# Patient Record
Sex: Female | Born: 1935 | Marital: Single
Health system: Southern US, Community
[De-identification: ages and names within clinical notes are randomized; demographics above are authoritative.]

---

## 2004-10-12 ENCOUNTER — Other Ambulatory Visit: Payer: Self-pay

## 2004-10-12 ENCOUNTER — Inpatient Hospital Stay: Payer: Self-pay | Admitting: Internal Medicine

## 2006-09-25 ENCOUNTER — Other Ambulatory Visit: Payer: Self-pay

## 2006-09-26 ENCOUNTER — Inpatient Hospital Stay: Payer: Self-pay | Admitting: Internal Medicine

## 2006-09-30 ENCOUNTER — Other Ambulatory Visit: Payer: Self-pay

## 2011-12-11 ENCOUNTER — Ambulatory Visit: Payer: Self-pay | Admitting: Internal Medicine

## 2012-01-02 ENCOUNTER — Inpatient Hospital Stay: Payer: Self-pay | Admitting: Internal Medicine

## 2012-01-02 LAB — CBC WITH DIFFERENTIAL/PLATELET
Basophil %: 0.8 %
Eosinophil #: 0.4 10*3/uL (ref 0.0–0.7)
HCT: 33.7 % — ABNORMAL LOW (ref 35.0–47.0)
HGB: 10.8 g/dL — ABNORMAL LOW (ref 12.0–16.0)
Lymphocyte #: 1.5 10*3/uL (ref 1.0–3.6)
Lymphocyte %: 14.6 %
MCH: 26.2 pg (ref 26.0–34.0)
MCHC: 31.9 g/dL — ABNORMAL LOW (ref 32.0–36.0)
MCV: 82 fL (ref 80–100)
Monocyte %: 11.6 %
Neutrophil #: 7.3 10*3/uL — ABNORMAL HIGH (ref 1.4–6.5)
Neutrophil %: 69.6 %
Platelet: 429 10*3/uL (ref 150–440)
RBC: 4.11 10*6/uL (ref 3.80–5.20)
RDW: 15.6 % — ABNORMAL HIGH (ref 11.5–14.5)
WBC: 10.5 10*3/uL (ref 3.6–11.0)

## 2012-01-02 LAB — TROPONIN I: Troponin-I: <0.02 ng/mL

## 2012-01-02 LAB — PRO B NATRIURETIC PEPTIDE: B-Type Natriuretic Peptide: 1313 pg/mL — ABNORMAL HIGH (ref 0–450)

## 2012-01-02 LAB — COMPREHENSIVE METABOLIC PANEL
Albumin: 2.4 g/dL — ABNORMAL LOW (ref 3.4–5.0)
Anion Gap: 8 (ref 7–16)
Bilirubin,Total: 0.4 mg/dL (ref 0.2–1.0)
Calcium, Total: 8.7 mg/dL (ref 8.5–10.1)
Chloride: 95 mmol/L — ABNORMAL LOW (ref 98–107)
Co2: 31 mmol/L (ref 21–32)
Creatinine: 0.56 mg/dL — ABNORMAL LOW (ref 0.60–1.30)
EGFR (Non-African Amer.): >60
Osmolality: 268 (ref 275–301)
Potassium: 2.8 mmol/L — ABNORMAL LOW (ref 3.5–5.1)
SGPT (ALT): 11 U/L — ABNORMAL LOW
Sodium: 134 mmol/L — ABNORMAL LOW (ref 136–145)
Total Protein: 7.8 g/dL (ref 6.4–8.2)

## 2012-01-03 DIAGNOSIS — I059 Rheumatic mitral valve disease, unspecified: Secondary | ICD-10-CM

## 2012-01-05 LAB — IRON AND TIBC
Iron Bind.Cap.(Total): 413 ug/dL (ref 250–450)
Unbound Iron-Bind.Cap.: 373 ug/dL

## 2012-01-05 LAB — POTASSIUM: Potassium: 4.4 mmol/L (ref 3.5–5.1)

## 2012-01-05 LAB — HEMOGLOBIN: HGB: 11.2 g/dL — ABNORMAL LOW (ref 12.0–16.0)

## 2012-01-05 LAB — PLATELET COUNT: Platelet: 400 10*3/uL (ref 150–440)

## 2012-01-05 LAB — CREATININE, SERUM: EGFR (African American): >60

## 2012-01-05 LAB — FERRITIN: Ferritin (ARMC): 55 ng/mL (ref 8–388)

## 2012-01-06 LAB — CULTURE, BLOOD (SINGLE)

## 2012-01-08 LAB — PROT IMMUNOELECTROPHORES(ARMC)

## 2012-01-08 LAB — CULTURE, BLOOD (SINGLE)

## 2012-01-08 LAB — CEA: CEA: 10.8 ng/mL — ABNORMAL HIGH (ref 0.0–4.7)

## 2012-01-11 ENCOUNTER — Ambulatory Visit: Payer: Self-pay | Admitting: Internal Medicine

## 2012-10-10 ENCOUNTER — Ambulatory Visit: Payer: Self-pay | Admitting: Internal Medicine

## 2012-10-18 ENCOUNTER — Inpatient Hospital Stay: Payer: Self-pay | Admitting: Orthopedic Surgery

## 2012-10-18 ENCOUNTER — Ambulatory Visit: Payer: Self-pay | Admitting: Orthopedic Surgery

## 2012-10-18 LAB — COMPREHENSIVE METABOLIC PANEL
Albumin: 2.6 g/dL — ABNORMAL LOW (ref 3.4–5.0)
Alkaline Phosphatase: 101 U/L (ref 50–136)
Bilirubin,Total: 0.4 mg/dL (ref 0.2–1.0)
Calcium, Total: 8 mg/dL — ABNORMAL LOW (ref 8.5–10.1)
Chloride: 89 mmol/L — ABNORMAL LOW (ref 98–107)
Osmolality: 261 (ref 275–301)
Potassium: 2.8 mmol/L — ABNORMAL LOW (ref 3.5–5.1)
SGOT(AST): 21 U/L (ref 15–37)
SGPT (ALT): 9 U/L — ABNORMAL LOW (ref 12–78)
Sodium: 130 mmol/L — ABNORMAL LOW (ref 136–145)
Total Protein: 6.9 g/dL (ref 6.4–8.2)

## 2012-10-18 LAB — CBC WITH DIFFERENTIAL/PLATELET
Basophil %: 0.7 %
Eosinophil #: 0.5 10*3/uL (ref 0.0–0.7)
Eosinophil %: 4.4 %
HCT: 29.6 % — ABNORMAL LOW (ref 35.0–47.0)
Lymphocyte #: 1.5 10*3/uL (ref 1.0–3.6)
MCHC: 33 g/dL (ref 32.0–36.0)
Neutrophil #: 8.8 10*3/uL — ABNORMAL HIGH (ref 1.4–6.5)
Neutrophil %: 72.9 %
Platelet: 295 10*3/uL (ref 150–440)
RDW: 15.2 % — ABNORMAL HIGH (ref 11.5–14.5)

## 2012-10-18 LAB — PROTIME-INR: INR: 1

## 2012-10-19 LAB — URINALYSIS, COMPLETE
Bacteria: NONE SEEN
Bilirubin,UR: NEGATIVE
Glucose,UR: NEGATIVE mg/dL (ref 0–75)
Ketone: NEGATIVE
Nitrite: NEGATIVE
RBC,UR: 2 /HPF (ref 0–5)
Specific Gravity: 1.013 (ref 1.003–1.030)
Squamous Epithelial: <1
WBC UR: 11 /HPF (ref 0–5)

## 2012-10-19 LAB — POTASSIUM: Potassium: 3 mmol/L — ABNORMAL LOW (ref 3.5–5.1)

## 2012-10-20 LAB — BASIC METABOLIC PANEL
Anion Gap: 6 — ABNORMAL LOW (ref 7–16)
Calcium, Total: 7.9 mg/dL — ABNORMAL LOW (ref 8.5–10.1)
Chloride: 96 mmol/L — ABNORMAL LOW (ref 98–107)
Co2: 31 mmol/L (ref 21–32)
EGFR (Non-African Amer.): 50 — ABNORMAL LOW
Glucose: 141 mg/dL — ABNORMAL HIGH (ref 65–99)
Osmolality: 269 (ref 275–301)
Sodium: 133 mmol/L — ABNORMAL LOW (ref 136–145)

## 2012-10-20 LAB — PRO B NATRIURETIC PEPTIDE: B-Type Natriuretic Peptide: 5759 pg/mL — ABNORMAL HIGH (ref 0–450)

## 2012-10-20 LAB — HEMOGLOBIN
HGB: 7.9 g/dL — ABNORMAL LOW (ref 12.0–16.0)
HGB: 10.2 g/dL — ABNORMAL LOW (ref 12.0–16.0)

## 2012-10-21 LAB — CBC WITH DIFFERENTIAL/PLATELET
Eosinophil #: 0 10*3/uL (ref 0.0–0.7)
Lymphocyte #: 0.9 10*3/uL — ABNORMAL LOW (ref 1.0–3.6)
Neutrophil %: 91.8 %
RDW: 14.9 % — ABNORMAL HIGH (ref 11.5–14.5)

## 2012-10-21 LAB — URINE CULTURE

## 2012-11-05 LAB — CBC
HCT: 29.6 % — ABNORMAL LOW (ref 35.0–47.0)
MCHC: 32.6 g/dL (ref 32.0–36.0)
MCV: 83 fL (ref 80–100)
RBC: 3.58 10*6/uL — ABNORMAL LOW (ref 3.80–5.20)
RDW: 17.8 % — ABNORMAL HIGH (ref 11.5–14.5)
WBC: 14.2 10*3/uL — ABNORMAL HIGH (ref 3.6–11.0)

## 2012-11-05 LAB — COMPREHENSIVE METABOLIC PANEL
Albumin: 2.7 g/dL — ABNORMAL LOW (ref 3.4–5.0)
BUN: 16 mg/dL (ref 7–18)
Co2: 28 mmol/L (ref 21–32)
EGFR (African American): >60
EGFR (Non-African Amer.): >60
Glucose: 114 mg/dL — ABNORMAL HIGH (ref 65–99)
Osmolality: 272 (ref 275–301)
SGOT(AST): 22 U/L (ref 15–37)
Total Protein: 7.5 g/dL (ref 6.4–8.2)

## 2012-11-05 LAB — TROPONIN I: Troponin-I: <0.02 ng/mL

## 2012-11-06 ENCOUNTER — Inpatient Hospital Stay: Payer: Self-pay | Admitting: Internal Medicine

## 2012-11-08 LAB — CBC WITH DIFFERENTIAL/PLATELET
Basophil #: 0 10*3/uL (ref 0.0–0.1)
Basophil %: 0.4 %
Eosinophil #: 0 10*3/uL (ref 0.0–0.7)
Eosinophil %: 0 %
HGB: 9.1 g/dL — ABNORMAL LOW (ref 12.0–16.0)
Lymphocyte #: 1.1 10*3/uL (ref 1.0–3.6)
MCH: 26.8 pg (ref 26.0–34.0)
MCHC: 32.9 g/dL (ref 32.0–36.0)
MCV: 81 fL (ref 80–100)
Monocyte %: 5.9 %
Platelet: 350 10*3/uL (ref 150–440)

## 2012-11-08 LAB — BASIC METABOLIC PANEL
Anion Gap: 7 (ref 7–16)
BUN: 16 mg/dL (ref 7–18)
Calcium, Total: 8.5 mg/dL (ref 8.5–10.1)
Creatinine: 0.78 mg/dL (ref 0.60–1.30)
EGFR (Non-African Amer.): >60
Glucose: 118 mg/dL — ABNORMAL HIGH (ref 65–99)
Osmolality: 263 (ref 275–301)

## 2012-11-10 ENCOUNTER — Ambulatory Visit: Payer: Self-pay | Admitting: Internal Medicine

## 2012-11-10 LAB — BASIC METABOLIC PANEL
Anion Gap: 4 — ABNORMAL LOW (ref 7–16)
Calcium, Total: 8.3 mg/dL — ABNORMAL LOW (ref 8.5–10.1)
Chloride: 97 mmol/L — ABNORMAL LOW (ref 98–107)
Creatinine: 0.92 mg/dL (ref 0.60–1.30)
EGFR (Non-African Amer.): 59 — ABNORMAL LOW
Glucose: 182 mg/dL — ABNORMAL HIGH (ref 65–99)
Potassium: 4.1 mmol/L (ref 3.5–5.1)
Sodium: 132 mmol/L — ABNORMAL LOW (ref 136–145)

## 2012-11-10 LAB — CBC WITH DIFFERENTIAL/PLATELET
Basophil %: 0 %
Eosinophil %: 0 %
HCT: 31.7 % — ABNORMAL LOW (ref 35.0–47.0)
HGB: 10.1 g/dL — ABNORMAL LOW (ref 12.0–16.0)
Lymphocyte #: 1 10*3/uL (ref 1.0–3.6)
Lymphocyte %: 5.2 %
MCHC: 31.8 g/dL — ABNORMAL LOW (ref 32.0–36.0)
Monocyte %: 5.8 %
Neutrophil #: 16.3 10*3/uL — ABNORMAL HIGH (ref 1.4–6.5)
Platelet: 344 10*3/uL (ref 150–440)
RDW: 17.4 % — ABNORMAL HIGH (ref 11.5–14.5)
WBC: 18.3 10*3/uL — ABNORMAL HIGH (ref 3.6–11.0)

## 2012-11-11 LAB — CULTURE, BLOOD (SINGLE)

## 2012-11-11 LAB — WBC: WBC: 18.5 10*3/uL — ABNORMAL HIGH (ref 3.6–11.0)

## 2012-12-10 DEATH — deceased

## 2014-12-02 NOTE — Discharge Summary (Signed)
PATIENT NAME:  Jill Nolan, Jill Nolan MR#:  [id number] DATE OF BIRTH:  05-27-1930  DATE OF ADMISSION:  11/06/2012 DATE OF DISCHARGE:  11/11/2012  ADMITTING PHYSICIAN:  Dr. Rudene Rearwish.   DISCHARGING PHYSICIAN:  Dr. Enid Baasadhika Ajamu Maxon.   PRIMARY CARE PHYSICIAN:  Dr. Walden FieldBillett.  PRIMARY ONCOLOGIST:  Dr. Lorre NickGittin.  CONSULTATIONS IN THE HOSPITAL:  1.  Palliative care consultation by Dr. Harriett SineNancy Phifer.  DISCHARGE DIAGNOSES: 1.  Sepsis. 2.  Healthcare-acquired pneumonia. 3.  Chronic obstructive pulmonary disease.  4.  Congestive heart failure, systolic dysfunction, ejection fraction of 45%.  5.  Atrial fib with rapid ventricular response.    6.  Rheumatoid arthritis.  7.  Right lung and liver mass, possible malignancy. The patient declined further.  8.  Bilateral heel pressure ulcers.  9.  Recent hip fracture.   DISCHARGE MEDICATIONS:   1.  Roxanol solution 20 mg/mL - 0.25 mL every 2 hours as needed for pain and dyspnea.  2.  Norco 5/325 mg 1 tablet q.4 p.r.n. pain.  3.  Lisinopril 5 mg p.o. daily.  4.  Maalox 30 mL q.6 hours p.r.n. for indigestion.  5.  Xanax 0.25 mg q.8 hours p.r.n. for anxiety.  6.  Coreg 6.25 mg p.o. b.i.d.  7.  Potassium chloride 20 mEq p.o. daily.  8.  Ranitidine 150 mg p.o. daily.  9.  Spiriva capsule 1 inhalation daily.  10.  Symbicort 160/4.5, 2 puffs b.i.d.  11.  Combivent Respimat 1 puff 4 times a day.  12.  DuoNeb 3 mL q.4 hours.  13.  Levaquin 500 mg p.o. daily for 5 more days.  14.  Lasix 40 mg p.o. daily.  15.  Augmentin 875 mg 1 tablet p.o. b.i.d. for 5 more days.   DISCHARGE DIET: Low-sodium, renal diet.   DISCHARGE OXYGEN:  Three liters.   DISCHARGE ACTIVITY: As tolerated.     FOLLOWUP INSTRUCTIONS: 1.  Hospice services at home.  2.  PCP followup in 2 weeks.   LABORATORY DATA AND IMAGING SERVICES:  1.  WBC 18.3, hemoglobin 10.1, hematocrit 31.7, platelet count 244.  2.  Sodium 132, potassium 4.1, chloride 97, bicarbonate 31, BUN 18, creatinine 0.92,  glucose 182, and calcium 8.3.  3.  Chest x-ray showing persistent right middle lobe and lower lobe pneumonias with bilateral pleural effusions and mild cardiomegaly.  4.  Blood cultures growing coagulase-negative staph.   BRIEF HOSPITAL COURSE:  The patient is an 81 year old elderly female with past medical history significant for hypertension, A. fib, congestive heart failure, COPD and recent admission to the hospital 2 weeks ago for fall and right hip fracture, status post surgery, was discharged to [facility]. Was brought in secondary to increased shortness of breath. Chest x-ray revealed right middle lobe and lower lobe infiltrates.   1.  Septic shock secondary to healthcare-acquired pneumonia. She was started on broad-spectrum antibiotics while in the hospital. Blood cultures initially were positive, but they were growing coagulase-negative staph, so vancomycin was discontinued,  she was on Rocephin and Levaquin while in the hospital and is being changed to Augmentin and Levaquin at time of discharge. She does have right lung mass that was found on CAT scan about a year ago, and seen by Dr. Lorre NickGittin, and also there was a spot in the liver. PET scan was ordered, but the patient refused at the time. Continues to refuse any further workup.  Her chest x-ray actually did not show any improvement. She has persistent white count; however, her breathing has improved,  and she is on 2 liters nasal cannula right now. The patient is very adamant of not going back to [facility] and does want to go home at the time of discharge. She was seen by palliative care,  who has arranged for hospice services at home.  The patient is fully alert and capable of making her own decisions. She was able to get out of bed with minimal assistance and to the bedside commode. Hospice is going to provide further care.  Her family was contacted and spoke with daughter-in-law, who lives with the patient, along with the patient's son,  and they are very worried about the patient coming home instead of going back to [facility], but they do understand that it is her choice to make, and they are in the process of getting a guardianship over the patient, and they do understand the patient has chances of repeat hospitalizations, and they want to avoid that and see if her condition declines, and they can take her to  hospice home.   2.   Atrial fib with rapid ventricular response.  The patient is not a candidate for anticoagulation with falls and recent hip surgery. Her rate is better controlled with Coreg at this time.  3.  Chronic obstructive pulmonary disease, not in acute exacerbation, not on systemic steroids.   Continue inhalers at this time.  4.  Recent right hip surgery. Follow up with ortho as an outpatient, working with physical therapy and pain medications p.r.n.    Her course has been otherwise uneventful in the hospital.   DISCHARGE CONDITION: Guarded with poor prognosis.   DISCHARGE DISPOSITION:  Home with hospice.   Time spent on discharge is 45 minutes.   ____________________________ Enid Baas, MD rk:dmm D: 11/12/2012 13:07:00 ET T: 11/12/2012 13:23:59 ET JOB#: [id number]  cc: Enid Baas, MD, <Dictator> Enid Baas MD ELECTRONICALLY SIGNED 11/27/2012 15:44

## 2014-12-02 NOTE — Consult Note (Signed)
PATIENT NAME:  Jill Nolan, Jill Nolan MR#:  [id number] DATE OF BIRTH:  [birth year]  DATE OF CONSULTATION:  11/06/2012  REFERRING PHYSICIAN:   CONSULTING PHYSICIAN:  Linus Galasodd Bitha Fauteux, DPM  REASON FOR CONSULTATION:  This is an 81 year old female with a history of ulcers on both of her heels who was recently admitted to the hospital for shortness of breath with evidence of pneumonia. Denies any specific injury to the heels.   PAST MEDICAL HISTORY: 1.  Right lung mass suspected to be cancer. 2. COPD primarily emphysema.  3. Chronic atrial fibrillation. 4. Chronic systolic heart failure.  5. Rheumatoid arthritis. 6. Systemic hypertension, 7. Tobacco abuse.   PAST SURGICAL HISTORY:  Recent left hip fracture, distant hysterectomy and cataracts.   SOCIAL HISTORY:  She is widowed. Right now lives at a nursing home.   SOCIAL HABITS:  She is an ex-chronic smoker. Quit smoking earlier this month. Also history of alcohol use but none recently.   REVIEW OF SYSTEMS:  Has had shortness of breath causing her admission. No chest pain. Denies any numbness or paresthesias in the feet. No complaints of swelling in the legs. Denies any stomach pain or heartburn.   PHYSICAL EXAMINATION: VASCULAR: DP pulse is nonpalpable on the right, trace at best on the left. PT pulse nonpalpable on the right and, again, trace at best. Capillary filling time appears to be intact.  NEUROLOGICAL:  Epicritic sensations appear to be grossly intact.  INTEGUMENT: Skin is dry and atrophic with absent hair growth. There is a scabbed-over ulceration on the posterior aspect of the left heel approximately 8 to 10 mm diameter with a fissured area going more plantar. No active drainage or sign of infection. Larger ulceration on the posterior aspect of the right heel approximately 2 cm x 1.5 cm. Again, no clear signs of cellulitis or infection.  MUSCULOSKELETAL: Exquisite pain on any attempted palpation or motion in the feet or ankles. Muscle testing  deferred.   IMPRESSION: 1.  Bilateral heel ulcerations.  2.  Evidence for some degree of vascular compromise.   PLAN: At this point, neither of the ulcerations appears to be infected. Would continue with offloading with the pressure relief boots and dressing changes. The patient will follow up as needed outpatient if no resolution of the ulcerations. Also, could consider vascular consult but at this point I do not clearly know that it is needed.     ____________________________ Linus Galasodd Nevin Grizzle, DPM tc:ce D: 11/06/2012 13:14:36 ET T: 11/06/2012 14:24:01 ET JOB#: [id number]  cc: Linus Galasodd Haidyn Kilburg, DPM, <Dictator> Euriah Matlack DPM ELECTRONICALLY SIGNED 11/14/2012 10:55

## 2014-12-02 NOTE — Consult Note (Signed)
Brief Consult Note: Diagnosis: left pertrochanteric fracture.   Patient was seen by consultant.   Consult note dictated.   Recommend further assessment or treatment.   Orders entered.   Discussed with Attending MD.   Comments: 81 yo with PMHx of COP (end stage on 3.5L O2) afib, tobacco abuse, RA, HTN, sCHF,  ?lung ca on hospice care being evaluated for preop medical clearance for left hip fracture.  1. Preop evaluation -METs 1 to 4, uses a walker - active cardiac condition --> sCHF, atrial fibrillation - Clinical risk factors --> ho of CHF, tobacco abuse, COPD on o2 - patent at moderate risk for cardiac complications and high risk for pulmonary complications - cont with home dose beta blocker - would recommend maximize medical mangement of cardiac issues and pulmonary status prior to surgery  2. HTN - continue with coreg  3. COPD - end stage on hospice care, currently only on 3.5L of O2 via [place] - currently still smoking 3-4 cigs per day, tobacco cessation provided.  - maintain saturation 88-92%  4. Left hip fracture  - management per surgery - pain management per surgery - conservative use of narcotics and benzos in th post operative period - may need to use positive pressure (Bipap or CPAP) in the post operative period  5. ? Lung Cancer - noted to have RLL nodular density at last admission, per son, they have followed up with Dr. Lorre NickGittin and Dr. Mayo AoFlemming, the concensus decision is to have no intervention (ie bronchoscopies, biopsies, chemo, radiation), and proceed with home hospice.   6. Recent Bronchitis - noted to have productive green to yellow sputum, treated with 10 days of augmentin, currently with mild yellow sputum, but with overall improvement.  - cbc in the am - sputum culture  DNR\DNI PMD - Dr. Letta KocherWillet  Time spent evaluating patient = 55 minutes Job# [id number].  Electronic Signatures: Stephanie AcreMungal, Vishal (MD)  (Signed 09-Mar-14 22:57)  Authored: Brief Consult  Note   Last Updated: 09-Mar-14 22:57 by Stephanie AcreMungal, Vishal (MD)

## 2014-12-02 NOTE — Op Note (Signed)
PATIENT NAME:  Jill Nolan, Jill Nolan MR#:  [id number] DATE OF BIRTH:  [birth year]  DATE OF PROCEDURE:  10/19/2012  PREOPERATIVE DIAGNOSIS:  Right intertrochanteric hip fracture.   POSTOPERATIVE DIAGNOSIS:  Right intertrochanteric hip fracture.  PROCEDURE PERFORMED: Open reduction and internal fixation with a long Affixus nail, right intertrochanteric hip fracture.   SURGEON: Leitha SchullerMichael J. Marshaun Lortie, MD.   DESCRIPTION OF PROCEDURE: The patient was brought to the operating room and after adequate anesthesia was obtained, the patient was placed on the fracture table. The left leg was in the well legholder, right leg in the traction boot. After bringing in C-arm and showing acceptable reduction in both AP and lateral projections, the hip was prepped and draped using the barrier drape method. After patient identification and timeout procedures were carried out, a small incision was made just proximal to the greater trochanter. A guidewire was inserted through the tip of the trochanter into the proximal shaft where proximal reaming was carried out. A long guidewire was then down the canal, measurements made off of this for rod length determination. A 13 mm reamer was placed down the canal without much chatter and an 11 x 360 mm long Affixus nail was inserted down the canal, and then inserted to the appropriate depth.  Using the guide from 130 degree arm, a small lateral incision was made and a guidewire inserted into a center/center position on the head, measured at 100 mm, drilling carried out and then the lag screw inserted, tightened and then a quarter turn loosening to allow for compression. AP and lateral images were saved. There appeared to be essentially anatomic alignment. The insertion handle was removed. There did appear to be rotational stability with removing the leg with no traction applied, so no additional distal fixation was required.   The wounds were irrigated and closed with 2-0 Vicryl subcutaneously and  skin staples.   ESTIMATED BLOOD LOSS: 100 mL.   COMPLICATIONS: None.   SPECIMEN:  None.   IMPLANTS:  Biomet Affixus 11 x 360 mm right 130 degree nail with a 10.5 by a 100 mm lag screw.   CONDITION: To recovery room stable.     ____________________________ Leitha SchullerMichael J. Carletta Feasel, MD mjm:ct D: 10/19/2012 19:57:50 ET T: 10/20/2012 10:38:21 ET JOB#: [id number]  cc: Leitha SchullerMichael J. Lachele Lievanos, MD, <Dictator> Leitha SchullerMICHAEL J Dariella Gillihan MD ELECTRONICALLY SIGNED 10/20/2012 14:58

## 2014-12-02 NOTE — H&P (Signed)
PATIENT NAME:  Jill Nolan, Jill Nolan MR#:  [id number] DATE OF BIRTH:  January 13, 1930  DATE OF ADMISSION:  11/06/2012  PRIMARY CARE PHYSICIAN:  Dr. Barry Brunner.   REFERRING PHYSICIAN:  Dr. Glennie Isle.   CHIEF COMPLAINT:  Increased shortness of breath.   HISTORY OF PRESENT ILLNESS:  The patient is an 81 year old Caucasian female with history of chronic obstructive pulmonary disease, history of right lung lower lobe mass suspected to be cancer, followed up by Dr. Lorre Nick and also Dr. Meredeth Ide.  It was decided that conservative therapy and palliative approach after discussion with the family according to the last record earlier this month when she was admitted here on March 9th wit ah hip fracture status post fall.  The patient was at [facility] this time and noticed to be more short of breath.  Her O2 saturation dropped to 83% despite oxygen.  Brought here for evaluation and chest x-ray here revealed evidence of pneumonia involving the right lower lobe and also right middle lobe.  The patient is now in the process to be admitted for further treatment.   REVIEW OF SYSTEMS:  CONSTITUTIONAL:  The patient denies having fever.  No chills.  No fatigue.  EYES:  No blurring of vision.  No double vision.  EARS, NOSE, THROAT:  No hearing impairment.  No sore throat.  No dysphagia.  CARDIOVASCULAR:  Denies any chest pain, but reports shortness of breath.  No syncope.  RESPIRATORY:  The patient reports shortness of breath.  Denies any chest pain.  She is coughing.  No sputum production.  No hemoptysis.  GASTROINTESTINAL:  No abdominal pain, no vomiting, no diarrhea.  GENITOURINARY:  No dysuria or frequency of urination.  MUSCULOSKELETAL:  No joint pain or swelling.  No muscular pain or swelling.  INTEGUMENTARY:  No skin rash.  No ulcers.  NEUROLOGY:  No focal weakness.  No seizure activity.  No headache.  PSYCHIATRY:  No anxiety.  No depression.  ENDOCRINE:  No polyuria or polydipsia.  No heat or cold  intolerance.   PAST MEDICAL HISTORY:   1.  Recent admission on March 9th admitted with a hip fracture status post fall.  2.  Right lung mass suspected to be cancer.  The patient was evaluated by Dr. Lorre Nick and also Dr. Meredeth Ide.  Her investigation was aborted and decided conservative approach.   3.  COPD, primarily emphysema.  4.  Chronic atrial fibrillation.  5.  Chronic systolic heart failure with ejection fraction of 45% to 50%.  6.  Rheumatoid arthritis.  7.  Systemic hypertension.  8.  Tobacco abuse.   PAST SURGICAL HISTORY:  Recent left hip fracture operated earlier this month.  Hysterectomy, cataract surgery.   SOCIAL HABITS:  Ex-chronic smoker.  She tells me that she quit smoking earlier this month.  Used to smoke 1/2 pack a day since age of 31.  She used to drink two beers a day, but she quit a few months ago.   SOCIAL HISTORY:  She is widowed, right now she lives at a nursing home.   FAMILY HISTORY:  Her mother died at age of 56 from congestive heart failure.  Her father died at the age of 59 from congestive heart failure.  Her brother died from unknown cancer.    ADMISSION MEDICATIONS:  Xanax 0.25 mg q. 8 hours as needed, Symbicort 2 puffs twice a day, Spiriva 1 inhalation once a day, ranitidine 150 mg once a day, potassium chloride 20 mEq.  Norco 5/325 q.  4 hours as needed and also taking 1 tablet twice a day.  Lisinopril 5 mg a day, Lasix 40 mg a day, fluoxetine 20 mg once a day.  DuoNeb q. 4 hours as needed, Coreg 6.25 mg twice a day, Combivent inhaler as needed.  The patient just finished treatment with Zithromax 5 day pack.   ALLERGIES:  BETAPACE, UNKNOWN REACTION.  SULFA.  ALSO REPORTED GOLD SODIUM THIOMALATE.   PHYSICAL EXAMINATION: VITAL SIGNS:  Blood pressure 162/78, respiratory rate 24, pulse 100, temperature 97.7.  O2 saturation is now 98% after oxygen supplementation.  GENERAL APPEARANCE:  Elderly female, thin-looking, appears to be short of breath and cachectic.   HEAD AND NECK:  Mild pallor.  No icterus.  No cyanosis.  Ear examination revealed normal hearing, no lesions, no ulcers, no discharge.  Nasal mucosa examination revealed dry mucous membranes, no discharge, no ulcers.  Examination of the oropharyngeal area showed no ulcers, no oral thrush.  Eye examination revealed normal eyelids and conjunctivae.  Pupils about 6 mm, round, equal, sluggishly reactive to light.  NECK:  Supple.  Trachea at midline.  No thyromegaly.  No cervical lymphadenopathy.  No masses.  HEART:  Revealed distant heart sounds, regular S1, S2.  No S3 or S4.  No murmur was appreciated.  No carotid bruits.  LUNGS:  The patient is slightly tachypneic and using mildly the accessory muscles.  She has coarse rhonchi.  These are more pronounced on the right side of the chest than the left.  ABDOMEN:  Soft without tenderness.  No hepatosplenomegaly.  No masses.  No hernias.  SKIN:  Revealed no ulcers.  No subcutaneous nodules.  MUSCULOSKELETAL:  No joint swelling.  No clubbing.  NEUROLOGIC:  Cranial nerves II through XII are intact.  No focal motor deficit.  PSYCHIATRY:  The patient is alert, oriented x 2, that is for the place and also the people.  Mood and affect were normal.   LABORATORY FINDINGS AND RADIOLOGIC DATA:  Chest x-ray showed infiltrates at the right middle lobe and right lower lobe.  These are more pronounced than her chest x-ray done on March 11th of this month.  EKG showed atrial fibrillation with rapid ventricular rate at 122 per minute.  Nonspecific T-wave abnormalities.  Serum glucose 114, BUN 16, creatinine 0.6, sodium 135, potassium 4.6.  Normal liver function tests and liver transaminases with exception of low albumin at 2.7.  Troponin less than 0.02.  CBC showed a white count of 14,000, hemoglobin 9.7, hematocrit 29, platelet count 425.   ASSESSMENT: 1.  Right lower lobe and right middle lobe pneumonia versus post obstructive pneumonitis.  2.  Right lung mass suspected  to be cancer.  The family and patient decided conservative therapy and approach.  No further investigation was done.  3.  Acute exacerbation of chronic obstructive pulmonary disease, primarily she has emphysema.  4.  Chronic atrial fibrillation presenting now with rapid ventricular rate likely secondary to the hypoxemia.  5.  Chronic systolic heart failure with ejection fraction of 45% to 50%.  This is compensated.   6.  Rheumatoid arthritis.  7.  Systemic hypertension.   8.  The patient was under care of hospice and she was DO NOT RESUSCITATE.   PLAN:  We will admit the patient to the medical floor, oxygen supplementation to maintain adequate oxygenation.  Blood cultures x 2.  Since the patient finished Zithromax we will change the antibiotic to Rocephin along with Levaquin.  Bronchodilator therapy with DuoNebs.  Hopefully with adequate oxygenation on breathing treatments her pulmonary findings improve and tachycardia subsequently may improve as well unless we are dealing with post obstructive pneumonitis which may progress towards worse course and in that case we may need to re-involve Dr. Lorre Nick .  For deep vein thrombosis prophylaxis, heparin 5000 units subcutaneous twice a day.  Continue the rest of home medications as listed above.   Time spent in evaluating this patient and reviewing medical records took more than 1 hour.     ____________________________ Carney Corners. Rudene Re, MD amd:ea D: 11/06/2012 03:16:36 ET T: 11/06/2012 05:09:35 ET JOB#: [id number]  cc: Carney Corners. Rudene Re, MD, <Dictator> Zollie Scale MD ELECTRONICALLY SIGNED 11/06/2012 7:15

## 2014-12-02 NOTE — Consult Note (Signed)
PATIENT NAME:  Jill Nolan, Jill Nolan MR#:  [id number] DATE OF BIRTH:  [birth year]  DATE OF CONSULTATION:  10/18/2012  EMERGENCY DEPARTMENT REFERRING PHYSICIAN:  Conni Slipper, MD CONSULTING PHYSICIAN:  Vilinda Boehringer, MD  REFERRING PHYSICIAN:  Claud Kelp, MD  PRIMARY CARE PHYSICIAN:  Fonnie Jarvis. Ilene Qua, MD  CHIEF COMPLAINT:  "I fell today."   HISTORY OF PRESENT ILLNESS:  This is an 81 year old Caucasian female with past medical history of end-stage COPD on hospice care and home O2, right lower lobe lung mass, atrial fibrillation and systolic CHF with an accidental fall today seen in consultation for preop medical clearance. History is per the patient and the son who I spoke with over the phone; his name is Jill Nolan; his phone number is [phone]. Jill Nolan stated that today the mother was at home. She uses a walker to walk and stated that she wanted to sit. She was walking backwards fairly quickly and they advised her to slow down; however, she accidentally tripped on herself and went down to the ground. No trauma to the head. No other bruises noted. Upon falling to the ground, they heard a crack on the left side of her hip and brought her to the ED right away. In the ED, she did have imaging studies that showed left pertrochanteric fracture. She was seen by Dr. Daine Gip, an orthopedic surgeon, and may need a pinning or fixation device placed for further stabilization.   PAST MEDICAL HISTORY:  She has a known medical history of Afib, systolic CHF, tobacco abuse, COPD, current cigarette use of 3 to 4 cigarettes per day. She was recently treated for bronchitis from her home hospice nurse with 10 days of Augmentin. Hospitalist services again were consulted for further preoperative medical clearance.   HOME MEDICATIONS:  Albuterol/ipratropium 2.5/0.5 mg 1 nebulizer every 8 hours as needed for shortness of breath and wheezing, Aleve sodium 220 mg 1 tab b.i.d., carvedilol 3.125 mg 1 tab b.i.d., fluoxetine 20 mg 1 tab in the  morning, furosemide 40 mg 1 tab a day, lisinopril 5 mg 1 tab once a day, ranitidine 150 mg 1 tab once a day.   PAST MEDICAL HISTORY:  Again, atrial fibrillation, systolic CHF, COPD end-stage on home O2 and hospice care, right lower lobe lung mass that is currently on hospice for, no further workup.   ALLERGIES:  BETA-BLOCKER, BETAPACE, GOLD SODIUM THIOMALATE, SULFA DRUGS.   LAST HOSPITALIZATION:  May 2013 for COPD exacerbation and pneumonia.   FAMILY HISTORY:  Positive smoking and heart disease.   SOCIAL HISTORY:  Current smoker, nondrinker.   REVIEW OF SYSTEMS:   CONSTITUTIONAL: No fatigue. Positive weakness in the lower extremities and left hip pain.  EYES: No blurry vision, double vision, pain or redness.  ENT: No tinnitus, ear pain, hearing loss, seasonal allergies.  RESPIRATORY: Positive chronic cough, mild wheeze, no hemoptysis. Positive shortness of breath on home O2. Positive COPD, recent bronchitis.  CARDIOVASCULAR: No chest pain, orthopnea, edema. Positive dyspnea on exertion.  GASTROINTESTINAL: No nausea, vomiting, diarrhea, abdominal pain.  GENITOURINARY: No dysuria, hematuria.  ENDOCRINOLOGY: No polyuria, nocturia or thyroid problems.  HEMATOLOGIC/LYMPHATICS: No anemia, easy bruising, bleeding, or swollen glands.  INTEGUMENTARY: No acne, rash, lesions, or change in any moles on skin.  MUSCULOSKELETAL: Positive pain in the left hip; otherwise, chronic rheumatoid arthritis. No recent swelling, gout or redness. Limited to using a walker.  NEUROLOGIC: Positive lower extremity weakness.  PSYCHIATRIC: Positive anxiety. Otherwise, no insomnia, ADD, OCD, bipolar, depression.   PHYSICAL EXAMINATION:  VITAL SIGNS: In the ED, her blood pressure is 150/74, respirations 20, pulse 109, temperature 97.5. She is at 99% on 3.5 liters of oxygen.  GENERAL: Mildly cachectic female in no acute respiratory distress lying in bed.  HEENT: PERRLA, EOMI. No scleral icterus. No difficulty hearing.  TMs are intact. No pharyngeal erythema. Mucous membranes are mildly dry. Otherwise, no acute lacerations noted on the head.  NECK: No thyroid enlargement. No nodules. Neck is supple and nontender. No adenopathy is noted on JVD. Full range of motion.  RESPIRATORY: Good airway entry in the upper airway. Some decreased breath sounds bilaterally with some mild diffuse expiratory wheezes. I believe this is chronic. She is currently wearing 3.5 liters of oxygen. No labored breathing, increased effort. No use of accessory muscles.  CARDIOVASCULAR: Irregularly irregular with S1, S2. No PMI lateralization. There is mild lower extremity bilateral 1+ edema to the mid shins.  ABDOMEN: Soft, nontender, nondistended. Positive bowel sounds.  MUSCULOSKELETAL: There is 4 out of 5 strength in her bilateral upper extremities. Left hip pain is noted on palpation. Strength is about 3 out of 5 in the bilateral lower extremities.  SKIN: No rash, lesions, erythema or nodules. Skin is warm and dry.  LYMPHATIC: No adenopathy noted in cervical, axilla or supraclavicular regions.  NEUROLOGIC: Cranial nerves II through XII intact. Deep tendon reflexes are intact.  PSYCHIATRIC: Alert and oriented to time and place, cooperative and good judgment.   DIAGNOSTIC DATA:  Her sodium is 130, potassium is 2.8, chloride 89, bicarbonate 32, BUN is 13, creatinine 1.07 and glucose is at 103. Her white cell count is 12.1, hemoglobin 9.8, hematocrit 29.6, platelet count 295. INR is at 1.0. EKG shows atrial fibrillation, rate of about 100 also, otherwise no acute ST-T wave changes. She does have a left anterior fascicular block. She did have a chest x-ray and x-ray of the left hip and pelvis. Currently, the official read is not up. Per the ED physician, she does have a left hip fracture, pertrochanteric.   ASSESSMENT AND PLAN:  An 81 year old female with a past medical history of chronic obstructive pulmonary disease, end-stage, on 3.5 liters of  oxygen, atrial fibrillation, tobacco abuse, rheumatoid arthritis, hypertension, systolic congestive heart failure. She does have a right lower lobe lung mass on hospice care, being evaluated for preoperative medical clearance for left hip fracture.  1.  Preoperative evaluation. Her MET score was 1 to 4. She uses a walker. Cannot climb stairs without assistance. Active cardiac conditions include systolic congestive heart failure and atrial fibrillation. Clinical risk factors include history of congestive heart failure, tobacco abuse and chronic obstructive pulmonary disease on oxygen. The patient is at moderate risk for cardiac complications and high risk for pulmonary complications in the postoperative and perioperative periods. Continue with beta-blocker home dose. I would recommend maximizing medical management of cardiac issues and pulmonary status prior to surgery.  2.  Hypertension. Continue with Coreg.  3.  Chronic obstructive pulmonary disease. She is end stage, on hospice care, currently on 3.5 liters of oxygen via nasal cannula. She is currently still smoking 3 to 4 cigarettes per day. Tobacco cessation is provided. Maintain saturations 88% to 93%. The patient may need to completely stop smoking at least 1 to 2 weeks prior to any kind of surgery, especially if she is having general anesthesia to avoid postoperative pulmonary complications which may include adult respiratory distress syndrome, acute respiratory failure and COPD Exacerbation.  4.  Left hip fracture, currently being managed by  surgery. Pain management per surgery. Conservative use of narcotics and benzos in the postoperative period. She may need to use positive pressure BiPAP or CPAP in the postoperative period given the history of chronic obstructive pulmonary disease and chronic oxygen dependence.  5.  Right lower lobe mass in the lung. She was noted to have a right lower lobe nodular density at the last admission. Per son, they follow  up with Dr. Inez Pilgrim and Dr. Raul Del from hematology/oncology and pulmonary respectively. The consenting position between the family and the current physicians is no intervention, i.e., no bronchoscopies, biopsies or chemoradiation at this time. They have proceeded with hospice care, which she is currently under hospice at home.  6.  Recent bronchitis, noted to have productive green to yellow sputum about 1 to 2 weeks ago treated with 10 days of Augmentin. Currently, the sputum is becoming back to its normal consistency and color. It still has a yellow tinge to it, but with overall improvement. Check a CBC in the morning and check a sputum culture.  7.  The patient is DO NOT RESUSCITATE AND DO NOT INTUBATE.   Thank you for this consult.   TIME SPENT DICTATING AND EVALUATING THE PATIENT:  55 minutes.    ____________________________ Vilinda Boehringer, MD vm:si D: 10/18/2012 22:55:00 ET T: 10/18/2012 23:49:43 ET JOB#: [id number]  cc: Vilinda Boehringer, MD, <Dictator> Vilinda Boehringer MD ELECTRONICALLY SIGNED 10/19/2012 8:29

## 2014-12-02 NOTE — H&P (Signed)
   Subjective/Chief Complaint Right hip pain   History of Present Illness 81 y/o female with MMP to include COPD,emphysema, CAD, HTN, currently on hospice care who fell and sustained a right intertrochanteric femure fracture. Of note, she is a minimal household ambulator and only transfers from bed to chair or commode.   Past Medical Health Coronary Artery Disease, Hypertension, COPD   Past Med/Surgical Hx:  copd:   Liver nodules:   lung:   CHF:   Rheumatoid arthritis:   HTN:   Glaucoma:   Emphysema:   Hysterectomy:   Cataract Extraction:   ALLERGIES:  Sulfa drugs: Unknown  Gold Sodium Thiomalate: Unknown  Betapace: Unknown  Beta Blockers: Unknown  Family and Social History:  Place of Living Home   Review of Systems:  Subjective/Chief Complaint right hip pain   Fever/Chills No   Cough Yes   Sputum Yes   Abdominal Pain No   Diarrhea No   Constipation No   Nausea/Vomiting No   SOB/DOE Yes   Chest Pain No   Physical Exam:  NECK supple   RESP wheezing   CARD regular rate   EXTR positive cyanosis/clubbing   SKIN skin turgor decreased   NEURO motor/sensory function intact   PSYCH A+O to time, place, person   Additional Comments RLE shortened and externally rotated    Assessment/Admission Diagnosis Right pertrochanteric femur fracture   Plan Admit for pain control. Discussed with patient/caregivers operative intervention options to improve mobility and decrease morbidity asssociated with being on bedrest. As patient is currently on hospice care and a minimal ambulator, the family needs to decide if they wish to pursue operative fixation versus continued comfort care.   Electronic Signatures: Danelle EarthlyEckel, Tobin T (MD)  (Signed 09-Mar-14 21:50)  Authored: CHIEF COMPLAINT and HISTORY, PAST MEDICAL/SURGIAL HISTORY, ALLERGIES, FAMILY AND SOCIAL HISTORY, REVIEW OF SYSTEMS, PHYSICAL EXAM, ASSESSMENT AND PLAN   Last Updated: 09-Mar-14 21:50 by Danelle EarthlyEckel, Tobin T  (MD)

## 2014-12-02 NOTE — Discharge Summary (Signed)
PATIENT NAME:  Jill Nolan, Jill Nolan MR#:  [id number] DATE OF BIRTH:  [birth year]  DATE OF ADMISSION:  10/18/2012 DATE OF DISCHARGE: 10/22/2012   ADMITTING DIAGNOSIS: Right intertrochanteric hip fracture.   DISCHARGE DIAGNOSIS: Right intertrochanteric hip fracture.  OPERATION: On 10/19/2012, the patient had ORIF with long Affixus nail of the right intertrochanteric hip fracture by Dr. Kennedy BuckerMichael Menz.   ESTIMATED BLOOD LOSS: 100 mL.   COMPLICATIONS: None.   IMPLANTS USED: [employer]omet Affixus 11 x 360 mm, right 130 degree nail with 10.5 x 100 mm lag screw.   CONDITION: Stable and she was sent to the recovery room.   HISTORY: The patient is an 81 year old female that presented with complaints of right hip pain. The patient had significant difficulty after a fall and could not ambulate. The patient has COPD, emphysema, hypertension and coronary artery disease. The patient has been working with hospice when she fell. The patient was ambulating bed to chair before the injury.   PHYSICAL EXAMINATION:  GENERAL: Alert female with some difficulty in wheezing.  RESPIRATIONS: Wheezing.  CARDIAC: Regular rate and rhythm.  MUSCULOSKELETAL: In regard to the right lower extremity, the patient had shortening and external rotation with pain with any attempt at motion or manipulation. There is diffuse edema as well.   HOSPITAL COURSE: After initial admission on 10/18/2012, the patient was brought to the operating room after medical clearance. The following day after surgery, on postoperative day 1, the patient's hemoglobin was 10.2 and on postoperative day 2 was at 9.6 and remained stable there. The patient was stable and was doing well, bed to chair with physical therapy. The patient could not ambulate beyond that. The patient was receiving breathing treatments including prednisone for her COPD and emphysema.   CONDITION AT DISCHARGE: Stable.   DISPOSITION: The patient was sent to rehab for physical therapy and  occupational therapy.   DISCHARGE INSTRUCTIONS: [facility]. The patient will have her leg elevated with 1 to 2 pillows and use knee-high TED hose on both legs, removed at bedtime. The patient will have her heels off the bed and encourage cough and deep breathing. The patient's diet is regular. The patient will have a dressing change on a p.r.n. basis. Nursing home will call if there is any bright red bleeding, calf pain, bowel or bladder difficulty, or any fever greater than 101.5. The patient will do physical therapy and occupational therapy.   DISCHARGE MEDICATIONS: Fluoxetine 20 mg 1 capsule daily, ranitidine 150 mg p.o. daily, prednisone 40 mg on 03/14, prednisone 30 mg on 03/15, prednisone 20 mg on 03/16 and prednisone 10 mg on 03/17, Norco 1 tablet q. 4 hours p.r.n. for severe pain, lisinopril 5 mg p.o. daily, Lovenox 30 mg subcu b.i.d. x 24 days, carvedilol 3.125 mg b.i.d., albuterol and ipratropium inhalation p.r.n., albuterol ipratropium CFC 100 mcg 1 puff q.i.d., tiotropium 18 mcg inhalation 1 capsule daily, furosemide 40 mg p.o. daily, potassium chloride 20 mEq p.o. daily and Levaquin 250 mg p.o. daily x 7 days.    ____________________________ Shela CommonsJ. Dedra Skeensodd Nakkia Mackiewicz, [place] [id number] D: 10/22/2012 09:17:35 ET T: 10/22/2012 09:24:29 ET JOB#: [id number]  cc: J. Dedra Skeensodd Tillie Viverette, [place], <Dictator> J Rashi Granier [facility] Center[facility]UN[facility] PA ELECTRONICALLY SIGNED 10/26/2012 7:44

## 2014-12-04 NOTE — Discharge Summary (Signed)
PATIENT NAME:  Jill Nolan, Jill Nolan MR#:  [id number] DATE OF BIRTH:  12-11-1929  DATE OF ADMISSION:  01/02/2012 DATE OF DISCHARGE:  01/05/2012  DISCHARGE DIAGNOSES:  1. Left leg cellulitis. 2. Acute on chronic respiratory failure due to chronic obstructive pulmonary disease exacerbation and congestive heart failure. 3. Acute systolic heart failure. 4. Acute bronchitis.  5. New onset atrial fibrillation. 6. Hypertension.  7. Anxiety. 8. Arthritis. 9. Possible lung cancer.   DISCHARGE MEDICATIONS:  1. Aspirin 325 mg p.o. daily. 2. Tramadol 50 mg every six hours as needed. 3. Mobic 7.5 mg p.o. daily.  4. Advair Diskus 250/50 one puff twice a day. 5. Vicodin 5/500 mg 1 to 2 tablets every six hours as needed.  6. Protonix 40 mg daily. 7. Diltiazem 180 mg daily. 8. Alprazolam 0.25 mg every six hours as needed.  9. Vitamin D 50,000 units once a week. 10. Plaquenil 200 mg p.o. daily.  11. Levaquin 500 mg daily for seven days. 12. Coreg 3.25 mg p.o. twice a day. 13. Lasix 40 mg daily.  14. KCl 20 milliequivalents p.o. daily while on Lasix. 15. Spiriva one capsule inhalation daily.  16. Prednisone 40 mg daily for two days, 30 mg daily for two days, 20 mg daily for two days, and 10 mg daily for two days then stop. 17. Lisinopril 5 mg p.o. daily.   DIET: Low sodium diet.   OXYGEN: 2 liters by nasal cannula.   ACTIVITY: As tolerated.  DISCHARGE FOLLOWUP: Followup with Dr. Ned Clines in 1 to 2 weeks and also Dr. Lorre Nick in 1 to 2 weeks.   CONSULTANTS:  1. Benita Gutter, MD - Oncology. 2. Erin Fulling, MD - Pulmonary.  3. Physical Therapy.  HOSPITAL COURSE:  1. The patient is a 81 year old female with history of chronic obstructive pulmonary disease, active smoking, and chronic bronchitis who came in with left leg swelling with redness and tenderness. Look at the history and physical for full details. She was admitted for cellulitis. The patient was started on Rocephin along with some  Lasix to help with edema. The patient's swelling got better and the tenderness also improved. Her blood cultures have been negative. The patient will be continued on Levaquin to finish the course.  2. Acute systolic heart failure - the patient did have some trouble breathing. BNP was slightly elevated at 13. Troponin was less than 0.02. Echocardiogram showed an ejection fraction of 45 to 50% with slightly decreased systolic function. She was started on Coreg along with lisinopril and Lasix also was given. The patient feels much better after Lasix was started. Right now swelling in the legs has decreased and trouble breathing is at baseline.  3. Leg swelling - the patient did have a deep vein thrombosis study which did not show any deep venous thrombosis.  4. Possible lung cancer - the patient's chest x-ray showed a nodular density in the right lower lobe and evaluation is recommended for possible cancer and underlying chronic obstructive pulmonary disease and pulmonary fibrosis is present. CT of the chest was done with contrast which showed bilateral lower lobe bronchial occlusion, possibly mucous plug versus tumor, and also stellate lesion in the right upper lobe. This could represent scarring. Bilateral lobe infiltrates and enhancing nodule in the right lobe of the liver which could be metastasis or hemangioma. Also bilateral adrenal fullness. The patient was seen by Dr. Lorre Nick. We also requested to have Dr. Ned Clines see the patient, but because of the long weekend he is  out of town. The patient was seen by Dr. Lorre NickGittin and he suggested that PET scan and further testing depending on PET scan results with liver biopsy or bronchoscopy or lung CT-guided biopsy. These evaluations can be done as an outpatient, according to him, so I am going to discharge the patient to follow up with Dr. Lorre NickGittin and also Dr. Meredeth IdeFleming regarding further work-up for pulmonary nodule.  5. Atrial fibrillation - the patient's heart  rate is around 88. Initially when she came in she was in atrial fibrillation with heart rate of 111. She was started on Coreg. The patient's heart rate stayed well on Coreg and the patient is on Cardizem at home so she can continue that. Her atrial fibrillation is thought to be secondary to chronic obstructive pulmonary disease and stress on the heart. Her echocardiogram showed slightly depressed LV function. Her CHADS score is around 3 so I decided to continue full dose Lovenox. Because the patient will probably need a biopsy, I am discharging her on only aspirin 325 mg daily, but further anticoagulation needs to be discussed by her primary doctor and also Dr. Barry BrunnerGlenn Willett to evaluate for further anticoagulation including Coumadin.  6. Chronic obstructive pulmonary disease with active smoking - the patient still smokes half pack per day. I explained to her that she has to quit and the fact that she might have lung cancer and chronic obstructive pulmonary disease. She is really not interested, but she said she will try. At this time, she is going to be on Spiriva, Advair, Combivent and also start tapering course along with antibiotics. The patient's saturations are around 97% on 2 liters, so we will check oxygen saturations on room air and on exertion and see if she will qualify for oxygen. Initially she was hypoxic around 93% on room air and on 3 liters she was 95%, but she feels better after starting steroids and nebulizers. Right now she is 97% on 2 liters, so we will see if she needs oxygen.   TIME SPENT ON DISCHARGE PREPARATION: More than 30 minutes.  ____________________________ Katha HammingSnehalatha Jniyah Dantuono, MD sk:[id number] D: 01/05/2012 11:36:37 ET T: 01/06/2012 13:52:02 ET JOB#: [id number]  cc: Katha HammingSnehalatha Hokulani Rogel, MD, <Dictator> Katha HammingSNEHALATHA Alicyn Klann MD ELECTRONICALLY SIGNED 01/14/2012 7:28

## 2014-12-04 NOTE — H&P (Signed)
PATIENT NAME:  Jill Nolan, Jill Nolan MR#:  [id number] DATE OF BIRTH:  [birth year]  DATE OF ADMISSION:  01/02/2012  PRIMARY CARE PHYSICIAN: Barry Brunner, MD  CHIEF COMPLAINT: Left leg redness and swelling x3 days.   HISTORY OF PRESENT ILLNESS: Jill Nolan is a 81 year old Caucasian female who was in her usual state of health until the last 3 to 4 days when she noticed increased edema in the lower extremities, left leg more than the right, then developed redness and picture of cellulitis extending from the foot up to the knee. There is no associated fever or chills. The patient was admitted for treatment of cellulitis.   REVIEW OF SYSTEMS: CONSTITUTIONAL: Denies any fever. No chills. No fatigue. EYES: No blurring of vision. No double vision. ENT: No hearing impairment. No sore throat. No dysphagia. CARDIOVASCULAR: No chest pain. No shortness of breath. No palpitations. No syncope. RESPIRATORY: No cough. No shortness of breath. No chest pain. No hemoptysis. GASTROINTESTINAL: No abdominal pain. No vomiting. No diarrhea. GENITOURINARY: No dysuria. No frequency of urination. MUSCULOSKELETAL: She has arthritis, especially small joints of her hands associated with swelling or joints. No muscular pain or swelling. INTEGUMENTARY: No skin rash other than the left lower extremity cellulitis. No ulcers although there are some skin abrasions on the left leg. NEUROLOGY: No focal weakness. No seizure activity. No headache. PSYCHIATRY: No anxiety. No depression. ENDOCRINE: No polyuria or polydipsia. No heat or cold intolerance.   PAST MEDICAL HISTORY:  1. Rheumatoid arthritis. 2. Systemic hypertension. 3. Emphysema. 4. Glaucoma.  5. Tobacco abuse. 6. Possible history of congestive heart failure, but no records about her echocardiogram or whether it is systolic or diastolic.   PAST SURGICAL HISTORY:  1. Hysterectomy.  2. Cataract surgery.   SOCIAL HABITS: Chronic smoker of 1/2 pack of cigarettes per day since age of 40.  She drinks two beers a day.   SOCIAL HISTORY: She is widowed and lives at home alone.   FAMILY HISTORY: Her mother died at age of 94 from congestive heart failure. Her father died at the age of 60 from congestive heart failure. Her brother died from unknown cancer.   ADMISSION MEDICATIONS: Amlodipine 5 mg a day; this is the only medication she is taking currently.   ALLERGIES: Sulfa and Betapace.  PHYSICAL EXAMINATION:   VITAL SIGNS: Blood pressure 134/76, respiratory rate 20, pulse 103, temperature 96.7, and oxygen saturation 93%.   GENERAL APPEARANCE: Elderly thin lady lying in bed in no acute distress.   HEAD AND NECK: No pallor. No icterus. No cyanosis.   ENT: Hearing was slightly diminished. Nasal mucosa, lips, and tongue were normal. She is edentulous and she has dentures.   EYES: Normal eyelids and conjunctiva. Pupils are about 5 mm, equal and sluggishly reactive to light.   NECK: Supple. Trachea at midline. No thyromegaly. No cervical lymphadenopathy. No masses.   HEART: Irregular S1 and S2. No S3 or S4. No murmur. No gallop. No carotid bruits.   LUNGS: Normal breathing pattern without use of accessory muscles. No rales. No wheezing.   ABDOMEN: Soft without tenderness. No hepatosplenomegaly. No masses. No hernias.   SKIN: Redness and findings consistent with cellulitis of the left lower extremity from the knee down to the foot associated with edema. There are some superficial erosions and mild ulcers on the left leg. No other ulcers elsewhere.   MUSCULOSKELETAL: The patient has swelling and deformities of small joints of both hands consistent with her rheumatoid arthritis.   NEUROLOGIC: Cranial nerves  II through XII are intact. No focal motor deficit.   PSYCHIATRY: The patient is alert and oriented to place, people, and time. Mood and affect were normal.   LABS/STUDIES: Chest x-ray showed mild cardiomegaly. No consolidation. No effusion. There are increased pulmonary  markings. Possible atelectasis at the right lower lung zone.   EKG showed atrial fibrillation with rapid ventricular rate at 112 per minute. Left axis deviation possibly secondary to left anterior hemiblock. Poor progression of R waves in the anterior chest leads, otherwise unremarkable EKG.  CBC showed white count 10,000, hemoglobin 10.8, hematocrit 33, platelet count 429, and MCV and MCH were normal. Total CPK 29. Troponin 0.02. Liver function tests showed albumin is low at 2.4. Normal liver transaminases. Serum glucose 113. B-type natriuretic peptide (BNP) 1313. BUN 9, creatinine 0.5, sodium 134, and potassium 2.8. GFR is estimated at more than 60.   ASSESSMENT:  1. Left leg cellulitis. 2. Lower extremity edema consistent with volume overload and also elevated B-type natriuretic peptide. 3. Atrial fibrillation with rapid ventricular rate. We do not have any records about the patient and her son does not know whether she has prior history of irregular heart beats or atrial fibrillation. The patient is also asymptomatic.  4. Hypokalemia and mild hyponatremia.  5. Systemic hypertension.  6. Normocytic, normochromic anemia.  7. Tobacco abuse.  8. Rheumatoid arthritis. 9. Emphysema. 10. Glaucoma.   PLAN: We will admit the patient to the medical floor. The patient received vancomycin in the emergency department. I will change that to Rocephin 1 gram daily. I will start intravenous Lasix twice a day to reduce the volume overload and peripheral edema. Potassium supplementation. The patient will receive one dose of 40 mEq intravenously then we will start oral supplementation as well. I will place the patient on aspirin, enteric-coated 325 mg a day, and start Coreg 12.5 mg twice a day to control the ventricular rate. It may be of benefit to contact her primary care physician in the morning to clarify whether she has history of atrial fibrillation in the past and when was her last echocardiogram. The  patient needs to quit smoking. I spoke with the patient and also with her son. She has no Living Will, but she had appointed her older son, Jill Nolan, to have the power of attorney.   TIME SPENT IN EVALUATING THE PATIENT: More than 55 minutes. ____________________________ Carney CornersAmir M. Rudene Rearwish, MD amd:slb D: 01/02/2012 02:20:31 ET T: 01/02/2012 11:16:27 ET JOB#: [id number]  cc: Carney CornersAmir M. Rudene Rearwish, MD, <Dictator> Jorje GuildGlenn R. Beckey DowningWillett, MD Zollie ScaleAMIR M Lark Langenfeld MD ELECTRONICALLY SIGNED 01/04/2012 0:22

## 2014-12-04 NOTE — Consult Note (Signed)
PATIENT NAME:  Jill Nolan, Jill Nolan MR#:  [id number] DATE OF BIRTH:  June 27, 1930  DATE OF CONSULTATION:  01/04/2012  REFERRING PHYSICIAN:   CONSULTING PHYSICIAN:  Knute Neuobert G. Lorre NickGittin, MD  HISTORY OF PRESENT ILLNESS: Ms. Jill Nolan is a 81 year old patient who was admitted on 05/23 and saw her and evaluated her on 01/04/2012, placed note on the chart on that day. This full dictation is delayed until this moment. Patient was admitted with cellulitis. She had increased edema in the extremities left more than right and redness without fever or chills. She was admitted and Doppler ultrasound done ruled out any clot. Chest x-ray showed abnormalities in the lungs but a CT was recommended. Patient was in rapid atrial fibrillation. She was given vancomycin and Zosyn and changed to Rocephin. On 05/24 a CT of the chest with contrast was done showed some apparent mucus plugging bilateral lower lobe bronchial area versus endobronchial tumor and stellate lesion in the right upper lobe with scarring versus malignancy and basilar atelectasis and enhancing nodule in the right lobe of the liver, possibly metastatic versus hemangioma, also nonspecific bilateral adrenal fullness. She was treated for fibrillation and hypertension and for acute on chronic respiratory failure with nebulizers and Solu-Medrol and Z-Pak was added.   PAST MEDICAL HISTORY:  1. Rheumatoid arthritis.  2. Hypertension.  3. Emphysema.  4. Glaucoma.  5. Possible congestive heart failure.   PAST SURGICAL HISTORY:   1. Hysterectomy.  2. Cataracts.   SOCIAL HISTORY: Half pack a day smoker. It was listed that this was since age 89 and drinks two beers daily. Widowed. Lives at home alone.   FAMILY HISTORY: A brother had some type of cancer. No other malignancy history known.   MEDICATIONS: She was only on amlodipine 5 mg a day on admission.   PHYSICAL EXAMINATION:  VITAL SIGNS: When patient was seen vital signs were stable.   GENERAL: She was alert and  cooperative and was not in respiratory distress when I saw her. She was elderly, thin, cachectic appearing with pallor, alert, cooperative.  NEUROLOGIC: Grossly nonfocal but seemed to have some cognitive lapses in terms of remembering her history and answering questions directly.   HEART: Nonregular.   RESPIRATORY: There is no respiratory distress and there was decreased air entry. No wheezing or rales.   ABDOMEN: Nontender. No palpable mass or organomegaly.   EXTREMITIES: There was trivial edema below the knee on the left and was almost no residual redness. No blanching. No tenderness.   NEUROLOGIC: Grossly nonfocal. I did not test her gait, she was in the bed.   MUSCULOSKELETAL: The joints show consistent with rheumatoid arthritis and changes in the hands.   LABORATORY, DIAGNOSTIC AND RADIOLOGICAL DATA: Labs, x-ray and CT has been noted and EKG showed atrial fibrillation and the white count showed 10,000. Hemoglobin was 10.8, platelets 429. Liver chemistries were unremarkable. Creatinine was 0.5. Potassium was low at 2.8.   IMPRESSION AND PLAN: Patient with multiple medical problems including atrial fibrillation was treated, potassium was replaced. Patient's creatinine was monitored by medicine. She was treated with antibiotics with good result with the cellulitis improving and a clot was ruled out. CT scan shows high possibility of malignancy versus scarring. Patient has lung nodules that could be rheumatoid arthritis or metastatic cancer and has a more dominant spiculated lesion that could be scar or primary cancer and there are some nonspecific findings in the adrenals and there is also worrisome finding in the liver that radiologist described as possible hemangioma  but was enhancing and could be also a metastatic lesion. Patient is debilitated but we discussed the possibility of cancer focalized and/or metastatic and patient wants to know the findings, wants to proceed, is aware there might  be some risk to interventions including a biopsy. She does not appear to be a good candidate for aggressive chemotherapy, if she had more chemotherapy sensitive tumor or underlying cancer sensitive to targeted agents or lesion later amenable to palliative radiation then she have some treatment options.   Suggest that patient should get a PET scan and following that would possibly have a bronchoscopy or CT-guided biopsy either lung or liver depending on the PET scanning. It is possible other imaging or contrast CT dedicated to the liver, liver MRI might be valuable but I would favor going to a PET scan initially. I would follow up the hemoglobin which is probably a combination of chronic disease and acute infection but would check electrophoresis and iron studies and B12. All further evaluations could be done as an outpatient. Patient is stable for discharge. Patient was in agreement with these recommendations.   ____________________________ Knute Neu Lorre Nick, MD rgg:cms D: 01/05/2012 17:27:49 ET T: 01/06/2012 08:30:02 ET JOB#: [id number]  cc: Knute Neu. Lorre Nick, MD, <Dictator> Marin Roberts MD ELECTRONICALLY SIGNED 01/26/2012 12:03

## 2014-12-04 NOTE — Consult Note (Signed)
Brief Consult Note: Diagnosis: SPICULATED LUNG MASS, LUNG NODULES, LIVER NODULE, COPD, CELLULITIS, A FIB.   Patient was seen by consultant.   Recommend further assessment or treatment.   Comments: DICTATED NOTE TO FOLLOW.  CELLULITIS IMPROVING, LEG NOT TENDER, MINIM AL ERYTHEMA, CHRONIC 02, COPD,  NO ACUTE DISTRESS, CACHECTIC AND DEBILITATED, ALERT, COGNITIVE IMPAIREMENT, DIFFICULTY CONCENTRATING, UNDERSTANDS DIAGNOSIS OF POSSIBLE CANCER. HAS SPICULATED LUNG MASS SUSPICIOUS FOR CANCER. HAS LUNG NODULES POSSIBLE CANCER OR RA. HAS LIVER NODULE SUSPICUIOUS FOR CANCER COULD BE HEMANGIOMA. HAS ADREANAL FULLNESS NONSPECIFIC.  SUGGEST PET/CT, THEN DEPENDING ON RESULT EITHER LIVER BX, OR BRONCHOSCOPY, OR LUNG CT GUIDED BX. THESE EVALUATIONS COULD BE DONE AS OUT PATIENT IF OTHERWISE STABLE FOR DISCHARGE.  Electronic Signatures: Marin RobertsGittin, Robert G (MD)  (Signed 25-May-13 15:00)  Authored: Brief Consult Note   Last Updated: 25-May-13 15:00 by Marin RobertsGittin, Robert G (MD)

## 2015-03-24 IMAGING — CR DG CHEST 2V
1 series · 2 of 2 positions shown · non-contrast
Comparison: none

REASON FOR EXAM: follow up pneumonia
COMMENTS:

[Series 8: x chest ap · 0.14mm/px · 2 of 2 slices shown]
[im 1/2]
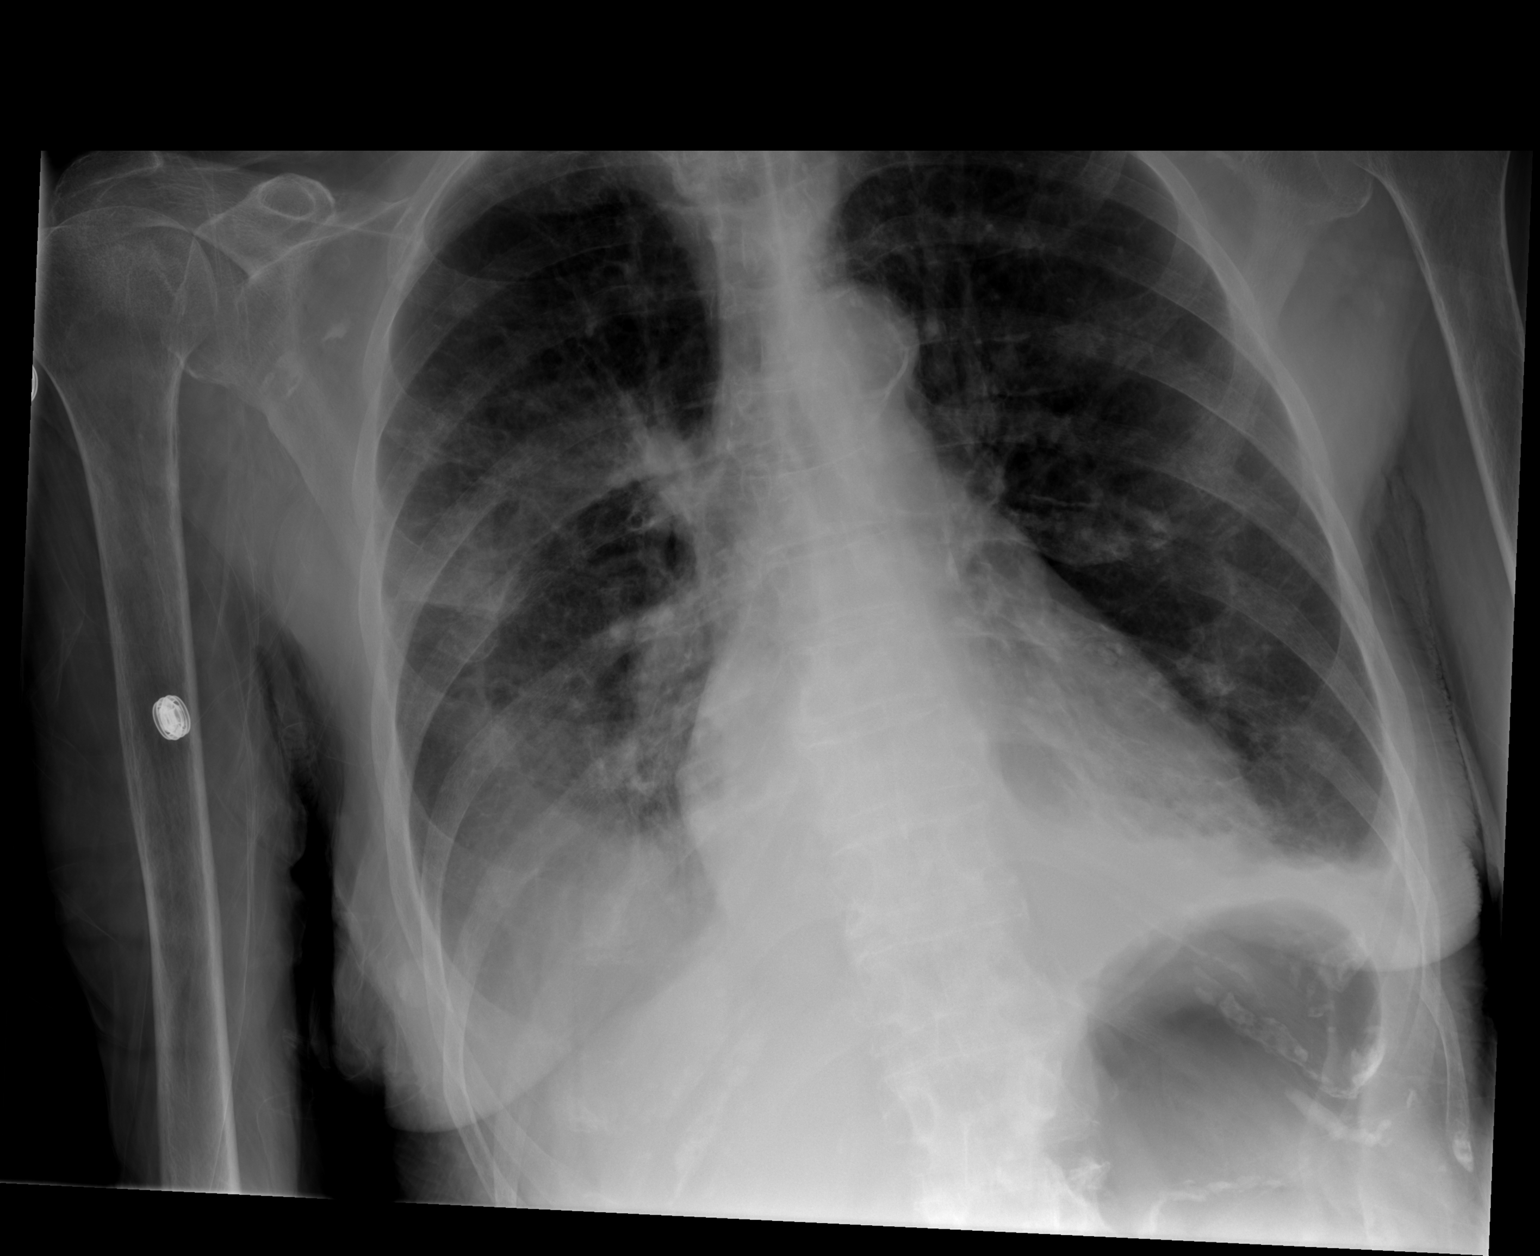
[im 2/2]
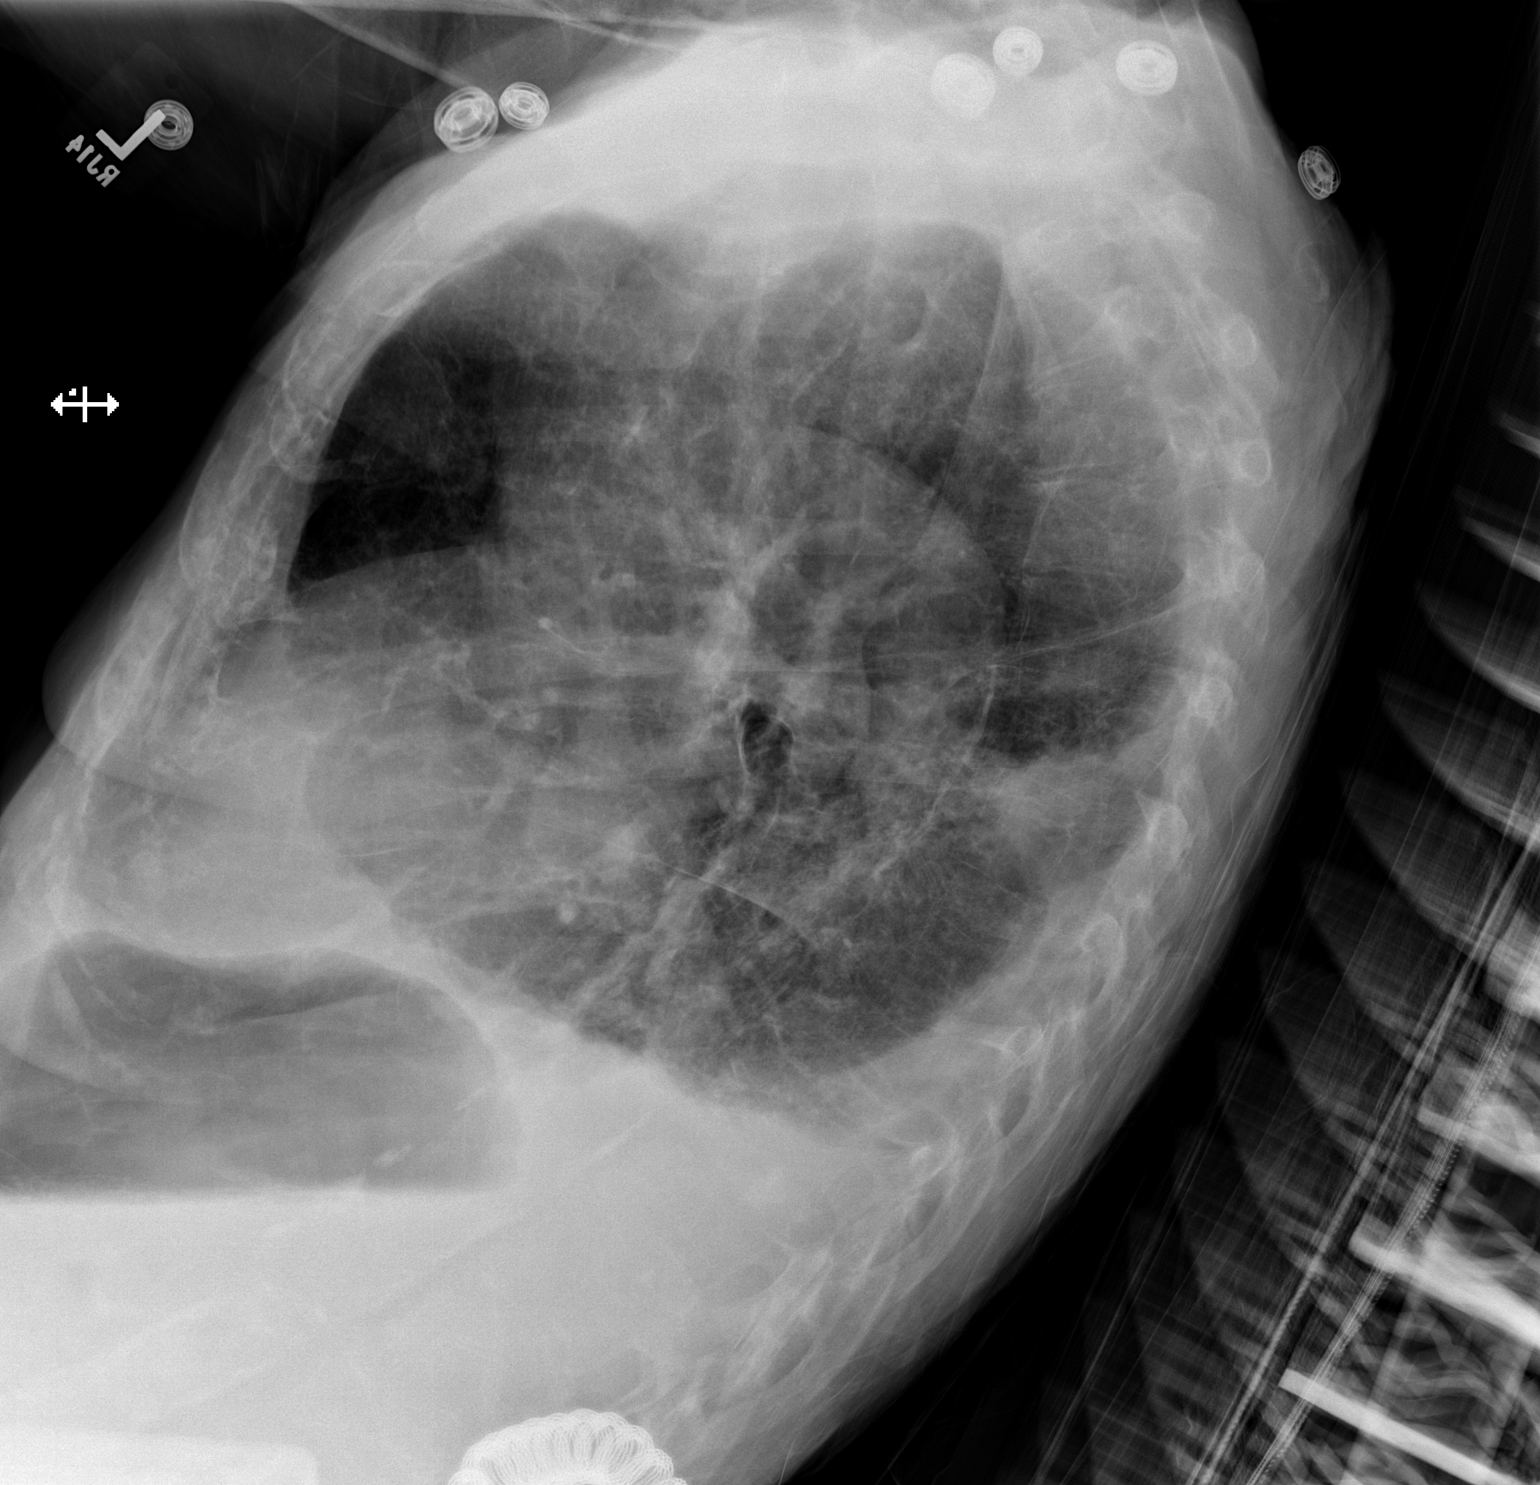

[2 of 2 positions shown; findings below may reference images not displayed]

PROCEDURE:     DXR - DXR CHEST PA (OR AP) AND LATERAL  - November 09, 2012  [DATE]

RESULT:     Comparison is made to previous study 05 November, 2012.

The cardiac silhouette is enlarged. There is persistent increased density in
the mid and lower right lung zones. Small bilateral pleural effusions are
present. Underlying emphysematous lung disease is demonstrated. There is
atherosclerotic calcification in the aortic arch. Opacification of the right
hemithorax has increased since 20 October, 2012 and a 05 November, 2012.
IMPRESSION: Increasing patchy density in the mid and lower right lung.
Bilateral pleural effusions are present. There is mild cardiomegaly.
Atherosclerotic calcification is present.

[REDACTED]
